# Patient Record
Sex: Male | Born: 1990 | Race: Black or African American | Hispanic: No | Marital: Single | Smoking: Never smoker
Health system: Southern US, Community
[De-identification: ages and names within clinical notes are randomized; demographics above are authoritative.]

## PROBLEM LIST (undated history)

## (undated) HISTORY — PX: NO PAST SURGERIES: SHX2092

---

## 2014-04-28 ENCOUNTER — Encounter (HOSPITAL_COMMUNITY): Payer: Self-pay | Admitting: *Deleted

## 2014-04-28 ENCOUNTER — Emergency Department (HOSPITAL_COMMUNITY): Payer: Self-pay

## 2014-04-28 ENCOUNTER — Emergency Department (HOSPITAL_COMMUNITY)
Admission: EM | Admit: 2014-04-28 | Discharge: 2014-04-28 | Disposition: A | Discharge status: Home / Self Care | Payer: Self-pay | Attending: Emergency Medicine | Admitting: Emergency Medicine

## 2014-04-28 DIAGNOSIS — J159 Unspecified bacterial pneumonia: Secondary | ICD-10-CM | POA: Insufficient documentation

## 2014-04-28 DIAGNOSIS — J189 Pneumonia, unspecified organism: Secondary | ICD-10-CM

## 2014-04-28 LAB — CBC WITH DIFFERENTIAL/PLATELET
BASOS ABS: 0 10*3/uL (ref 0.0–0.1)
Basophils Relative: 0 % (ref 0–1)
EOS ABS: 0 10*3/uL (ref 0.0–0.7)
EOS PCT: 0 % (ref 0–5)
HEMATOCRIT: 40.9 % (ref 39.0–52.0)
Hemoglobin: 14 g/dL (ref 13.0–17.0)
Lymphocytes Relative: 17 % (ref 12–46)
Lymphs Abs: 1.9 10*3/uL (ref 0.7–4.0)
MCH: 29.5 pg (ref 26.0–34.0)
MCHC: 34.2 g/dL (ref 30.0–36.0)
MCV: 86.1 fL (ref 78.0–100.0)
MONOS PCT: 10 % (ref 3–12)
Monocytes Absolute: 1.2 10*3/uL — ABNORMAL HIGH (ref 0.1–1.0)
NEUTROS ABS: 8.2 10*3/uL — AB (ref 1.7–7.7)
NEUTROS PCT: 73 % (ref 43–77)
PLATELETS: 239 10*3/uL (ref 150–400)
RBC: 4.75 MIL/uL (ref 4.22–5.81)
RDW: 14 % (ref 11.5–15.5)
WBC: 11.3 10*3/uL — AB (ref 4.0–10.5)

## 2014-04-28 LAB — COMPREHENSIVE METABOLIC PANEL
ALT: 20 U/L (ref 0–53)
ANION GAP: 5 (ref 5–15)
AST: 30 U/L (ref 0–37)
Albumin: 3.8 g/dL (ref 3.5–5.2)
Alkaline Phosphatase: 66 U/L (ref 39–117)
BILIRUBIN TOTAL: 0.8 mg/dL (ref 0.3–1.2)
BUN: 7 mg/dL (ref 6–23)
CHLORIDE: 99 mmol/L (ref 96–112)
CO2: 27 mmol/L (ref 19–32)
Calcium: 9.1 mg/dL (ref 8.4–10.5)
Creatinine, Ser: 1.11 mg/dL (ref 0.50–1.35)
GFR calc Af Amer: >90 mL/min (ref >90)
GLUCOSE: 104 mg/dL — AB (ref 70–99)
POTASSIUM: 3.7 mmol/L (ref 3.5–5.1)
SODIUM: 131 mmol/L — AB (ref 135–145)
Total Protein: 8.1 g/dL (ref 6.0–8.3)

## 2014-04-28 MED ORDER — AZITHROMYCIN 250 MG PO TABS: ORAL_TABLET | ORAL | Status: AC

## 2014-04-28 MED ORDER — IBUPROFEN 400 MG PO TABS
600.0000 mg | ORAL_TABLET | Freq: Once | ORAL | Status: AC
  Administered 2014-04-28: 600 mg via ORAL
  Filled 2014-04-28 (×2): qty 1

## 2014-04-28 MED ORDER — ACETAMINOPHEN 500 MG PO TABS: 500.0000 mg | ORAL_TABLET | Freq: Four times a day (QID) | ORAL | Status: AC | PRN

## 2014-04-28 NOTE — Discharge Instructions (Signed)
Take antibiotics for 5 days. If you were given medicines take as directed.  If you are on coumadin or contraceptives realize their levels and effectiveness is altered by many different medicines.  If you have any reaction (rash, tongues swelling, other) to the medicines stop taking and see a physician.   Please follow up as directed and return to the ER or see a physician for new or worsening symptoms.  Thank you. Filed Vitals:   04/28/14 1636 04/28/14 1856  BP: 126/82 109/73  Pulse: 101 96  Temp: 99 F (37.2 C) 99 F (37.2 C)  TempSrc:  Oral  Resp: 20 18  Height: 5\' 11"  (1.803 m)   SpO2: 97% 95%

## 2014-04-28 NOTE — Progress Notes (Signed)
ED CM consulted by EDP, Zavitz  medication assistance CM reviewed EPIC notes and chart review information CM spoke with the pt about [facility] ($3 co pay for each Rx through [facility], does not include refills, 7 day expiration of MATCH letter and choice of pharmacies) Pt agreed to receive assistance from program. Patient reports that he is homeless and cannot afford the $3 copay.  CM spoke with pt who [place]. CM discussed and provided written information for self pay pcps, importance of pcp for f/u care, www.needymeds.org, discounted pharmacies and other [place] resources such as financial assistance, DSS and  health department  Reviewed resources for [place] self pay pcps like [facility], family medicine at [address], [facility], general medical clinics, [facility] plus others, [facility] out patient pharmacies, housing, and other resources in [place]. Pt voiced understanding and appreciation of resources provided  Pt is eligible for [facility] program (unable to find pt listed in PDMI per cardholder name inquiry) PDMI information entered. MATCH letter completed and provided to pt. CM updated EDP and ED. No further ED CM needs identified

## 2014-04-28 NOTE — ED Notes (Signed)
Waiting for case manager to come talk to patient

## 2014-04-28 NOTE — ED Provider Notes (Signed)
CSN: [id number]     Arrival date & time 04/28/14  1625 History   First MD Initiated Contact with Patient 04/28/14 1915     Chief Complaint  Patient presents with  . Cough     (Consider location/radiation/quality/duration/timing/severity/associated sxs/prior Treatment) HPI Comments: 24 year old male with no significant medical history, nonsmoker presents with persistent productive cough and fevers for the past 3-4 weeks. No significant sick contacts, no immunosuppression history, no significant travel. No current antibiotics. Symptoms gradually worsening.  Patient is a 24 y.o. male presenting with cough. The history is provided by the patient.  Cough Associated symptoms: chills and fever   Associated symptoms: no chest pain, no headaches, no rash and no shortness of breath     History reviewed. No pertinent past medical history. History reviewed. No pertinent past surgical history. No family history on file. History  Substance Use Topics  . Smoking status: Never Smoker   . Smokeless tobacco: Not on file  . Alcohol Use: No    Review of Systems  Constitutional: Positive for fever and chills.  HENT: Positive for congestion.   Eyes: Negative for visual disturbance.  Respiratory: Positive for cough. Negative for shortness of breath.   Cardiovascular: Negative for chest pain.  Gastrointestinal: Negative for vomiting and abdominal pain.  Genitourinary: Negative for dysuria and flank pain.  Musculoskeletal: Negative for back pain, neck pain and neck stiffness.  Skin: Negative for rash.  Neurological: Negative for light-headedness and headaches.      Allergies  Review of patient's allergies indicates no known allergies.  Home Medications   Prior to Admission medications   Medication Sig Start Date End Date Taking? Authorizing Provider  azithromycin (ZITHROMAX Z-PAK) 250 MG tablet 2 po day one, then 1 daily x 4 days 04/28/14   Enid SkeensJoshua M Sabena Winner, MD   BP 109/73 mmHg  Pulse 96   Temp(Src) 99 F (37.2 C) (Oral)  Resp 18  Ht 5\' 11"  (1.803 m)  SpO2 95% Physical Exam  Constitutional: He is oriented to person, place, and time. He appears well-developed and well-nourished.  HENT:  Head: Normocephalic and atraumatic.  Congested in the room coughing  Eyes: Conjunctivae are normal. Right eye exhibits no discharge. Left eye exhibits no discharge.  Neck: Normal range of motion. Neck supple. No tracheal deviation present.  Cardiovascular: Normal rate and regular rhythm.   Pulmonary/Chest: Effort normal and breath sounds normal.  Patient has focal rales left base lung  Abdominal: Soft. He exhibits no distension. There is no tenderness. There is no guarding.  Musculoskeletal: He exhibits no edema.  Neurological: He is alert and oriented to person, place, and time.  Skin: Skin is warm. No rash noted.  Psychiatric: He has a normal mood and affect.  Nursing note and vitals reviewed.   ED Course  Procedures (including critical care time) Labs Review Labs Reviewed  CBC WITH DIFFERENTIAL/PLATELET - Abnormal; Notable for the following:    WBC 11.3 (*)    Neutro Abs 8.2 (*)    Monocytes Absolute 1.2 (*)    All other components within normal limits  COMPREHENSIVE METABOLIC PANEL - Abnormal; Notable for the following:    Sodium 131 (*)    Glucose, Bld 104 (*)    All other components within normal limits    Imaging Review Dg Chest 2 View  04/28/2014   CLINICAL DATA:  Cough, congestion, headache has 4 weeks  EXAM: CHEST  2 VIEW  COMPARISON:  None.  FINDINGS: Expiratory frontal radiograph. Lungs are clear  on the lateral view. No pleural effusion or pneumothorax.  The heart is normal in size.  Visualized osseous structures are within normal limits.  IMPRESSION: No evidence of acute cardiopulmonary disease.   Electronically Signed   By: Charline Bills M.D.   On: 04/28/2014 18:13     EKG Interpretation None      MDM   Final diagnoses:  CAP (community acquired  pneumonia)   Patient had blood work and x-ray done in triage, x-ray reviewed no acute findings. Patient has focal rales on exam clinically Mucor pneumonia. Plan for azithromycin and close outpatient follow-up.  Results and differential diagnosis were discussed with the patient/parent/guardian. Close follow up outpatient was discussed, comfortable with the plan.   Medications  ibuprofen (ADVIL,MOTRIN) tablet 600 mg (600 mg Oral Given 04/28/14 2002)    Filed Vitals:   04/28/14 1636 04/28/14 1856  BP: 126/82 109/73  Pulse: 101 96  Temp: 99 F (37.2 C) 99 F (37.2 C)  TempSrc:  Oral  Resp: 20 18  Height:  (1.803 m)   SpO2: 97% 95%    Final diagnoses:  CAP (community acquired pneumonia)        Enid Skeens, MD 04/28/14 2012
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
## 2014-10-02 ENCOUNTER — Encounter (HOSPITAL_COMMUNITY): Payer: Self-pay | Admitting: Emergency Medicine

## 2014-10-02 ENCOUNTER — Emergency Department (HOSPITAL_COMMUNITY)
Admission: EM | Admit: 2014-10-02 | Discharge: 2014-10-02 | Disposition: A | Discharge status: Home / Self Care | Payer: No Typology Code available for payment source | Attending: Emergency Medicine | Admitting: Emergency Medicine

## 2014-10-02 ENCOUNTER — Emergency Department (HOSPITAL_COMMUNITY): Payer: No Typology Code available for payment source

## 2014-10-02 DIAGNOSIS — X58XXXA Exposure to other specified factors, initial encounter: Secondary | ICD-10-CM | POA: Insufficient documentation

## 2014-10-02 DIAGNOSIS — Y998 Other external cause status: Secondary | ICD-10-CM | POA: Insufficient documentation

## 2014-10-02 DIAGNOSIS — S8992XA Unspecified injury of left lower leg, initial encounter: Secondary | ICD-10-CM | POA: Insufficient documentation

## 2014-10-02 DIAGNOSIS — S80212A Abrasion, left knee, initial encounter: Secondary | ICD-10-CM | POA: Insufficient documentation

## 2014-10-02 DIAGNOSIS — S022XXA Fracture of nasal bones, initial encounter for closed fracture: Secondary | ICD-10-CM

## 2014-10-02 DIAGNOSIS — Z23 Encounter for immunization: Secondary | ICD-10-CM | POA: Insufficient documentation

## 2014-10-02 DIAGNOSIS — Y9339 Activity, other involving climbing, rappelling and jumping off: Secondary | ICD-10-CM | POA: Insufficient documentation

## 2014-10-02 DIAGNOSIS — S99912A Unspecified injury of left ankle, initial encounter: Secondary | ICD-10-CM | POA: Insufficient documentation

## 2014-10-02 DIAGNOSIS — Y9289 Other specified places as the place of occurrence of the external cause: Secondary | ICD-10-CM | POA: Insufficient documentation

## 2014-10-02 DIAGNOSIS — M25522 Pain in left elbow: Secondary | ICD-10-CM

## 2014-10-02 DIAGNOSIS — S59902A Unspecified injury of left elbow, initial encounter: Secondary | ICD-10-CM | POA: Insufficient documentation

## 2014-10-02 MED ORDER — TETANUS-DIPHTH-ACELL PERTUSSIS 5-2.5-18.5 LF-MCG/0.5 IM SUSP
0.5000 mL | Freq: Once | INTRAMUSCULAR | Status: AC
  Administered 2014-10-02: 0.5 mL via INTRAMUSCULAR
  Filled 2014-10-02: qty 0.5

## 2014-10-02 MED ORDER — NAPROXEN 500 MG PO TABS: 500.0000 mg | ORAL_TABLET | Freq: Two times a day (BID) | ORAL | Status: AC
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
## 2014-10-02 NOTE — Discharge Instructions (Signed)
Your x-ray showed fracture of your nose. It should heal on its own, if continued to have pain or difficulty breathing please follow-up. Keep an Ace wrap on your left elbow, keep it elevated and ice several times a day. Please follow-up with an orthopedic specialist.  Elbow Contusion An elbow contusion is a deep bruise of the elbow. Contusions are the result of an injury that caused bleeding under the skin. The contusion may turn blue, purple, or yellow. Minor injuries will give you a painless contusion, but more severe contusions may stay painful and swollen for a few weeks.  CAUSES  An elbow contusion comes from a direct force to that area, such as falling on the elbow. SYMPTOMS   Swelling and redness of the elbow.  Bruising of the elbow area.  Tenderness or soreness of the elbow. DIAGNOSIS  You will have a physical exam and will be asked about your history. You may need an X-ray of your elbow to look for a broken bone (fracture).  TREATMENT  A sling or splint may be needed to support your injury. Resting, elevating, and applying cold compresses to the elbow area are often the best treatments for an elbow contusion. Over-the-counter medicines may also be recommended for pain control. HOME CARE INSTRUCTIONS   Put ice on the injured area.  Put ice in a plastic bag.  Place a towel between your skin and the bag.  Leave the ice on for 15-20 minutes, 03-04 times a day.  Only take over-the-counter or prescription medicines for pain, discomfort, or fever as directed by your caregiver.  Rest your injured elbow until the pain and swelling are better.  Elevate your elbow to reduce swelling.  Apply a compression wrap as directed by your caregiver. This can help reduce swelling and motion. You may remove the wrap for sleeping, showers, and baths. If your fingers become numb, cold, or blue, take the wrap off and reapply it more loosely.  Use your elbow only as directed by your caregiver. You  may be asked to do range of motion exercises. Do them as directed.  See your caregiver as directed. It is very important to keep all follow-up appointments in order to avoid any long-term problems with your elbow, including chronic pain or inability to move your elbow normally. SEEK IMMEDIATE MEDICAL CARE IF:   You have increased redness, swelling, or pain in your elbow.  Your swelling or pain is not relieved with medicines.  You have swelling of the hand and fingers.  You are unable to move your fingers or wrist.  You begin to lose feeling in your hand or fingers.  Your fingers or hand become cold or blue. MAKE SURE YOU:   Understand these instructions.  Will watch your condition.  Will get help right away if you are not doing well or get worse. Document Released: 02/16/2006 Document Revised: 06/02/2011 Document Reviewed: 01/24/2011 [facility] Patient Information 2015 [employer] [place]. This information is not intended to replace advice given to you by your health care provider. Make sure you discuss any questions you have with your health care provider.
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
## 2019-05-11 ENCOUNTER — Encounter: Payer: Self-pay | Admitting: Nurse Practitioner

## 2019-05-11 ENCOUNTER — Ambulatory Visit: Payer: Self-pay | Attending: Nurse Practitioner | Admitting: Nurse Practitioner

## 2019-05-11 VITALS — Ht 71.0 in | Wt 212.0 lb

## 2019-05-11 DIAGNOSIS — Z13228 Encounter for screening for other metabolic disorders: Secondary | ICD-10-CM

## 2019-05-11 DIAGNOSIS — Z131 Encounter for screening for diabetes mellitus: Secondary | ICD-10-CM

## 2019-05-11 DIAGNOSIS — Z13220 Encounter for screening for lipoid disorders: Secondary | ICD-10-CM

## 2019-05-11 DIAGNOSIS — Z7689 Persons encountering health services in other specified circumstances: Secondary | ICD-10-CM

## 2019-05-11 DIAGNOSIS — Z130 Encounter for screening for diseases of the blood and blood-forming organs and certain disorders involving the immune mechanism: Secondary | ICD-10-CM

## 2019-05-11 NOTE — Progress Notes (Signed)
Virtual Visit via Telephone Note Due to national recommendations of social distancing due to [place] 19, telehealth visit is felt to be most appropriate for this patient at this time.  I discussed the limitations, risks, security and privacy concerns of performing an evaluation and management service by telephone and the availability of in person appointments. I also discussed with the patient that there may be a patient responsible charge related to this service. The patient expressed understanding and agreed to proceed.    I connected with Kinney Sinyard on 05/11/19  at   3:30 PM EST  EDT by telephone and verified that I am speaking with the correct person using two identifiers.   Consent I discussed the limitations, risks, security and privacy concerns of performing an evaluation and management service by telephone and the availability of in person appointments. I also discussed with the patient that there may be a patient responsible charge related to this service. The patient expressed understanding and agreed to proceed.   Location of Patient: Private Residence   Location of Provider: [place] [facility] [place] participating in Telemedicine visit: Geryl Rankins FNP-BC [place]    History of Present Illness: Telemedicine visit for: Establish Care   has no past medical history on file.   Requesting referral to the dentist for cavities to be filled. We explained the process of the orange card and that there is a waiting list. He will come in for labs and pick up financial assistance application next week.  He denies any previous history of diabetes, hypertension, hyperlipidemia or thyroid disorder.      History reviewed. No pertinent past medical history.  Past Surgical History:  Procedure Laterality Date  . NO PAST SURGERIES      Family History  Problem Relation Age of Onset  . Diabetes Mother   . Diabetes Maternal Grandmother   .  Hypertension Maternal Grandmother   . Diabetes Maternal Grandfather   . Hypertension Maternal Grandfather     Social History   Socioeconomic History  . Marital status: Single    Spouse name: Not on file  . Number of children: Not on file  . Years of education: Not on file  . Highest education level: Not on file  Occupational History  . Not on file  Tobacco Use  . Smoking status: Never Smoker  . Smokeless tobacco: Never Used  Substance and Sexual Activity  . Alcohol use: No  . Drug use: Not Currently  . Sexual activity: Not Currently  Other Topics Concern  . Not on file  Social History Narrative  . Not on file   Social Determinants of Health   Financial Resource Strain:   . Difficulty of Paying Living Expenses: Not on file  Food Insecurity:   . Worried About Charity fundraiser in the Last Year: Not on file  . Ran Out of Food in the Last Year: Not on file  Transportation Needs:   . Lack of Transportation (Medical): Not on file  . Lack of Transportation (Non-Medical): Not on file  Physical Activity:   . Days of Exercise per Week: Not on file  . Minutes of Exercise per Session: Not on file  Stress:   . Feeling of Stress : Not on file  Social Connections:   . Frequency of Communication with Friends and Family: Not on file  . Frequency of Social Gatherings with Friends and Family: Not on file  . Attends Religious  Services: Not on file  . Active Member of Clubs or Organizations: Not on file  . Attends Archivist Meetings: Not on file  . Marital Status: Not on file     Observations/Objective: Awake, alert and oriented x 3   Review of Systems  Constitutional: Negative for fever, malaise/fatigue and weight loss.       Cavities   HENT: Negative.  Negative for nosebleeds.   Eyes: Negative.  Negative for blurred vision, double vision and photophobia.  Respiratory: Negative.  Negative for cough and shortness of breath.   Cardiovascular: Negative.  Negative  for chest pain, palpitations and leg swelling.  Gastrointestinal: Negative.  Negative for heartburn, nausea and vomiting.  Musculoskeletal: Negative.  Negative for myalgias.  Neurological: Negative.  Negative for dizziness, focal weakness, seizures and headaches.  Psychiatric/Behavioral: Negative.  Negative for suicidal ideas.    Assessment and Plan: Levi was seen today for new patient (initial visit).  Diagnoses and all orders for this visit:  Encounter to establish care  Screening for deficiency anemia -     CBC; Future  Lipid screening -     Lipid panel; Future  Screening for metabolic disorder -     TGP49+IYME; Future  Encounter for screening for diabetes mellitus -     Hemoglobin A1c; Future     Follow Up Instructions Return for Fasting labs, Needs appointment with financial representative., NEEDS ORANGE CARD/CAFA APP.     I discussed the assessment and treatment plan with the patient. The patient was provided an opportunity to ask questions and all were answered. The patient agreed with the plan and demonstrated an understanding of the instructions.   The patient was advised to call back or seek an in-person evaluation if the symptoms worsen or if the condition fails to improve as anticipated.  I provided 13 minutes of non-face-to-face time during this encounter including median intraservice time, reviewing previous notes, labs, imaging, medications and explaining diagnosis and management.  Gildardo Pounds, FNP-BC

## 2019-05-18 ENCOUNTER — Other Ambulatory Visit: Payer: Self-pay

## 2019-06-08 ENCOUNTER — Ambulatory Visit: Payer: Self-pay | Attending: Family Medicine

## 2019-06-08 ENCOUNTER — Other Ambulatory Visit: Payer: Self-pay

## 2019-06-08 DIAGNOSIS — Z130 Encounter for screening for diseases of the blood and blood-forming organs and certain disorders involving the immune mechanism: Secondary | ICD-10-CM

## 2019-06-08 DIAGNOSIS — Z13228 Encounter for screening for other metabolic disorders: Secondary | ICD-10-CM

## 2019-06-08 DIAGNOSIS — Z131 Encounter for screening for diabetes mellitus: Secondary | ICD-10-CM

## 2019-06-08 DIAGNOSIS — Z13220 Encounter for screening for lipoid disorders: Secondary | ICD-10-CM

## 2019-06-09 LAB — LIPID PANEL
Chol/HDL Ratio: 6.3 ratio — ABNORMAL HIGH (ref 0.0–5.0)
Cholesterol, Total: 219 mg/dL — ABNORMAL HIGH (ref 100–199)
HDL: 35 mg/dL — ABNORMAL LOW (ref >39)
LDL Chol Calc (NIH): 146 mg/dL — ABNORMAL HIGH (ref 0–99)
Triglycerides: 210 mg/dL — ABNORMAL HIGH (ref 0–149)
VLDL Cholesterol Cal: 38 mg/dL (ref 5–40)

## 2019-06-09 LAB — CMP14+EGFR
ALT: 57 IU/L — ABNORMAL HIGH (ref 0–44)
AST: 32 IU/L (ref 0–40)
Albumin/Globulin Ratio: 1.3 (ref 1.2–2.2)
Albumin: 4.3 g/dL (ref 4.1–5.2)
Alkaline Phosphatase: 83 IU/L (ref 39–117)
BUN/Creatinine Ratio: 11 (ref 9–20)
BUN: 11 mg/dL (ref 6–20)
Bilirubin Total: <0.2 mg/dL (ref 0.0–1.2)
CO2: 25 mmol/L (ref 20–29)
Calcium: 10 mg/dL (ref 8.7–10.2)
Chloride: 100 mmol/L (ref 96–106)
Creatinine, Ser: 1 mg/dL (ref 0.76–1.27)
GFR calc Af Amer: 116 mL/min/{1.73_m2} (ref >59)
GFR calc non Af Amer: 101 mL/min/{1.73_m2} (ref >59)
Globulin, Total: 3.2 g/dL (ref 1.5–4.5)
Glucose: 106 mg/dL — ABNORMAL HIGH (ref 65–99)
Potassium: 4.6 mmol/L (ref 3.5–5.2)
Sodium: 137 mmol/L (ref 134–144)
Total Protein: 7.5 g/dL (ref 6.0–8.5)

## 2019-06-09 LAB — CBC
Hematocrit: 45.8 % (ref 37.5–51.0)
Hemoglobin: 15.4 g/dL (ref 13.0–17.7)
MCH: 29.3 pg (ref 26.6–33.0)
MCHC: 33.6 g/dL (ref 31.5–35.7)
MCV: 87 fL (ref 79–97)
Platelets: 240 10*3/uL (ref 150–450)
RBC: 5.25 x10E6/uL (ref 4.14–5.80)
RDW: 14.2 % (ref 11.6–15.4)
WBC: 5.3 10*3/uL (ref 3.4–10.8)

## 2019-06-09 LAB — HEMOGLOBIN A1C
Est. average glucose Bld gHb Est-mCnc: 123 mg/dL
Hgb A1c MFr Bld: 5.9 % — ABNORMAL HIGH (ref 4.8–5.6)

## 2019-11-23 ENCOUNTER — Telehealth: Payer: Self-pay

## 2019-11-23 NOTE — Telephone Encounter (Signed)
Copied from CRM [id number]. Topic: General - Call Back - No Documentation >> Nov 23, 2019 12:46 PM Randol Kern wrote: Pt called requesting a call back from office regarding orange card Best contact: [phone]

## 2019-12-02 ENCOUNTER — Telehealth: Payer: Self-pay | Admitting: Nurse Practitioner

## 2019-12-02 NOTE — Telephone Encounter (Signed)
Copied from CRM [id number]. Topic: General - Other >> Dec 01, 2019  9:52 AM Dalphine Handing A wrote: Patient would like a callback from [facility] in regards to orange card app Best contact [phone] >> Dec 02, 2019 11:42 AM Lauralee Evener wrote: Mikle Bosworth please call pt. Patient has questions regarding required documents.

## 2019-12-02 NOTE — Telephone Encounter (Signed)
I called the Pt LVM explaining him that the appt he had with the PCP is too far back that if her apply for these program we can not take care this bills, also he need to herr the PCP and when he finish he can schedule a financial appt at the FO and pick the application which it tell him what he need to bring to qualified for the programs
